# Patient Record
Sex: Male | Born: 1995 | Race: White | Hispanic: No | Marital: Single | Smoking: Never smoker
Health system: Southern US, Community
[De-identification: ages and names within clinical notes are randomized; demographics above are authoritative.]

## PROBLEM LIST (undated history)

## (undated) DIAGNOSIS — F329 Major depressive disorder, single episode, unspecified: Secondary | ICD-10-CM

## (undated) DIAGNOSIS — F32A Depression, unspecified: Secondary | ICD-10-CM

## (undated) DIAGNOSIS — F419 Anxiety disorder, unspecified: Secondary | ICD-10-CM

## (undated) HISTORY — DX: Major depressive disorder, single episode, unspecified: F32.9

## (undated) HISTORY — DX: Anxiety disorder, unspecified: F41.9

## (undated) HISTORY — DX: Depression, unspecified: F32.A

---
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
## 2017-01-11 ENCOUNTER — Ambulatory Visit: Payer: Managed Care, Other (non HMO)

## 2017-02-17 ENCOUNTER — Encounter: Payer: Self-pay | Admitting: Family Medicine

## 2017-02-17 ENCOUNTER — Ambulatory Visit: Payer: Managed Care, Other (non HMO) | Admitting: Family Medicine

## 2017-02-17 VITALS — BP 112/80 | HR 72 | Ht 72.0 in | Wt 195.0 lb

## 2017-02-17 DIAGNOSIS — Z789 Other specified health status: Secondary | ICD-10-CM | POA: Diagnosis not present

## 2017-02-17 DIAGNOSIS — Z0000 Encounter for general adult medical examination without abnormal findings: Secondary | ICD-10-CM | POA: Diagnosis not present

## 2017-02-17 DIAGNOSIS — Z23 Encounter for immunization: Secondary | ICD-10-CM | POA: Diagnosis not present

## 2017-02-17 NOTE — Progress Notes (Signed)
Name: Kevin DanielsCameron Hunt   MRN: [id number]    DOB: 06/08/1995   Date:02/17/2017       Progress Note  Subjective  Chief Complaint  Chief Complaint  Patient presents with  . tdap vacc    needed for college    Patient present for updating immunization.    No problem-specific Assessment & Plan notes found for this encounter.   Past Medical History:  Diagnosis Date  . Anxiety   . Depression     History reviewed. No pertinent surgical history.  Family History  Problem Relation Age of Onset  . Cancer Mother   . Diabetes Mother     Social History   Socioeconomic History  . Marital status: Unknown    Spouse name: Not on file  . Number of children: Not on file  . Years of education: Not on file  . Highest education level: Not on file  Social Needs  . Financial resource strain: Not on file  . Food insecurity - worry: Not on file  . Food insecurity - inability: Not on file  . Transportation needs - medical: Not on file  . Transportation needs - non-medical: Not on file  Occupational History  . Not on file  Tobacco Use  . Smoking status: Never Smoker  . Smokeless tobacco: Never Used  Substance and Sexual Activity  . Alcohol use: No    Alcohol/week: 0.0 oz  . Drug use: No  . Sexual activity: No  Other Topics Concern  . Not on file  Social History Narrative  . Not on file    Allergies  Allergen Reactions  . Sulfa Antibiotics     Outpatient Medications Prior to Visit  Medication Sig Dispense Refill  . DULoxetine (CYMBALTA) 60 MG capsule Take 1 capsule by mouth daily. Psychiatrist    . amoxicillin (AMOXIL) 500 MG capsule Take 1 capsule (500 mg total) by mouth 3 (three) times daily. 30 capsule 0  . neomycin-polymyxin-hydrocortisone (CORTISPORIN) 3.5-10000-1 ophthalmic suspension Place 4 drops into the left eye 4 (four) times daily. 7.5 mL 0   No facility-administered medications prior to visit.     Review of Systems  Constitutional: Negative for chills, fever,  malaise/fatigue and weight loss.  HENT: Negative for ear discharge, ear pain and sore throat.   Eyes: Negative for blurred vision.  Respiratory: Negative for cough, sputum production, shortness of breath and wheezing.   Cardiovascular: Negative for chest pain, palpitations and leg swelling.  Gastrointestinal: Negative for abdominal pain, blood in stool, constipation, diarrhea, heartburn, melena and nausea.  Genitourinary: Negative for dysuria, frequency, hematuria and urgency.  Musculoskeletal: Negative for back pain, joint pain, myalgias and neck pain.  Skin: Negative for rash.  Neurological: Negative for dizziness, tingling, sensory change, focal weakness and headaches.  Endo/Heme/Allergies: Negative for environmental allergies and polydipsia. Does not bruise/bleed easily.  Psychiatric/Behavioral: Negative for depression and suicidal ideas. The patient is not nervous/anxious and does not have insomnia.      Objective  Vitals:   02/17/17 1429  BP: 112/80  Pulse: 72  Weight: 195 lb (88.5 kg)  Height: 6' (1.829 m)    Physical Exam  Constitutional: He is oriented to person, place, and time and well-developed, well-nourished, and in no distress.  HENT:  Head: Normocephalic.  Right Ear: External ear normal.  Left Ear: External ear normal.  Nose: Nose normal.  Mouth/Throat: Oropharynx is clear and moist.  Eyes: Conjunctivae and EOM are normal. Pupils are equal, round, and reactive to light. Right eye  exhibits no discharge. Left eye exhibits no discharge. No scleral icterus.  Neck: Normal range of motion. Neck supple. No JVD present. No tracheal deviation present. No thyromegaly present.  Cardiovascular: Normal rate, regular rhythm, normal heart sounds and intact distal pulses. Exam reveals no gallop and no friction rub.  No murmur heard. Pulmonary/Chest: Breath sounds normal. No respiratory distress. He has no wheezes. He has no rales.  Abdominal: Soft. Bowel sounds are normal. He  exhibits no mass. There is no hepatosplenomegaly. There is no tenderness. There is no rebound, no guarding and no CVA tenderness.  Musculoskeletal: Normal range of motion. He exhibits no edema or tenderness.  Lymphadenopathy:    He has no cervical adenopathy.  Neurological: He is alert and oriented to person, place, and time. He has normal sensation, normal strength, normal reflexes and intact cranial nerves. No cranial nerve deficit.  Skin: Skin is warm. No rash noted.  Psychiatric: Mood and affect normal.  Nursing note and vitals reviewed.     Assessment & Plan  Problem List Items Addressed This Visit    None    Visit Diagnoses    General medical exam    -  Primary   review immunization/    Need for diphtheria-tetanus-pertussis (Tdap) vaccine       Relevant Orders   Tdap vaccine greater than or equal to 7yo IM (Completed)   Hepatitis B vaccination status unknown       Relevant Orders   Hepatitis B surface antibody      No orders of the defined types were placed in this encounter.     Dr. Hayden [facility]  02/17/17

## 2017-02-18 LAB — HEPATITIS B SURFACE ANTIBODY,QUALITATIVE: Hep B Surface Ab, Qual: NONREACTIVE

## 2017-02-23 ENCOUNTER — Ambulatory Visit (INDEPENDENT_AMBULATORY_CARE_PROVIDER_SITE_OTHER): Payer: Managed Care, Other (non HMO)

## 2017-02-23 DIAGNOSIS — Z23 Encounter for immunization: Secondary | ICD-10-CM

## 2017-02-23 MED ORDER — HEPATITIS B VAC RECOMBINANT 10 MCG/0.5ML IJ SUSP
0.5000 mL | Freq: Once | INTRAMUSCULAR | Status: AC
  Administered 2017-02-23: 0.5 mL via INTRAMUSCULAR

## 2017-03-23 ENCOUNTER — Ambulatory Visit (INDEPENDENT_AMBULATORY_CARE_PROVIDER_SITE_OTHER): Payer: Managed Care, Other (non HMO)

## 2017-03-23 DIAGNOSIS — Z23 Encounter for immunization: Secondary | ICD-10-CM | POA: Diagnosis not present

## 2017-03-23 MED ORDER — HEPATITIS B VAC RECOMBINANT 10 MCG/ML IJ SUSP
1.0000 mL | Freq: Once | INTRAMUSCULAR | Status: AC
  Administered 2017-03-23: 1 mL via INTRAMUSCULAR

## 2017-09-14 ENCOUNTER — Ambulatory Visit: Payer: Managed Care, Other (non HMO) | Admitting: Family Medicine

## 2017-10-24 ENCOUNTER — Ambulatory Visit (INDEPENDENT_AMBULATORY_CARE_PROVIDER_SITE_OTHER): Payer: Managed Care, Other (non HMO)

## 2017-10-24 DIAGNOSIS — Z23 Encounter for immunization: Secondary | ICD-10-CM

## 2017-10-24 MED ORDER — HEPATITIS B VAC RECOMBINANT 10 MCG/ML IJ SUSP
1.0000 mL | Freq: Once | INTRAMUSCULAR | Status: AC
  Administered 2017-10-24: 10 ug via INTRAMUSCULAR

## 2018-09-17 ENCOUNTER — Ambulatory Visit
Admission: EM | Admit: 2018-09-17 | Discharge: 2018-09-17 | Disposition: A | Discharge status: Home / Self Care | Payer: Managed Care, Other (non HMO) | Attending: Family Medicine | Admitting: Family Medicine

## 2018-09-17 ENCOUNTER — Other Ambulatory Visit: Payer: Self-pay

## 2018-09-17 DIAGNOSIS — L03115 Cellulitis of right lower limb: Secondary | ICD-10-CM

## 2018-09-17 DIAGNOSIS — L03116 Cellulitis of left lower limb: Secondary | ICD-10-CM

## 2018-09-17 MED ORDER — CEPHALEXIN 500 MG PO CAPS: 500.0000 mg | ORAL_CAPSULE | Freq: Four times a day (QID) | ORAL | 0 refills | Status: DC

## 2018-09-17 NOTE — ED Provider Notes (Signed)
[facility]    CSN: [id number] Arrival date & time: 09/17/18  1815      History   Chief Complaint Chief Complaint  Patient presents with  . Skin Problem    HPI Kevin Hunt is a 23 y.o. male.   23 yo male with a c/o lower leg skin redness, tenderness and swelling to both legs for the past week. Denies any injuries, fevers, chills, drainage.       Past Medical History:  Diagnosis Date  . Anxiety   . Depression     There are no active problems to display for this patient.   History reviewed. No pertinent surgical history.     Home Medications    Prior to Admission medications   Medication Sig Start Date End Date Taking? Authorizing Provider  cephALEXin (KEFLEX) 500 MG capsule Take 1 capsule (500 mg total) by mouth 4 (four) times daily for 7 days. 09/17/18 09/24/18  Norval Gable, MD  DULoxetine (CYMBALTA) 60 MG capsule Take 1 capsule by mouth daily. Psychiatrist 04/03/15   [provider]    Family History Family History  Problem Relation Age of Onset  . Cancer Mother   . Diabetes Mother     Social History Social History   Tobacco Use  . Smoking status: Never Smoker  . Smokeless tobacco: Never Used  Substance Use Topics  . Alcohol use: No    Alcohol/week: 0.0 standard drinks  . Drug use: No     Allergies   Sulfa antibiotics   Review of Systems Review of Systems   Physical Exam Triage Vital Signs ED Triage Vitals  Enc Vitals Group     BP 09/17/18 1826 127/68     Pulse Rate 09/17/18 1826 85     Resp 09/17/18 1826 16     Temp 09/17/18 1826 98.8 F (37.1 C)     Temp Source 09/17/18 1826 Oral     SpO2 09/17/18 1826 100 %     Weight 09/17/18 1827 194 lb (88 kg)     Height --      Head Circumference --      Peak Flow --      Pain Score 09/17/18 1826 6     Pain Loc --      Pain Edu? --      Excl. in [place]? --    No data found.  Updated Vital Signs BP 127/68 (BP Location: Left Arm)   Pulse 85   Temp 98.8 F  (37.1 C) (Oral)   Resp 16   Wt 88 kg   SpO2 100%   BMI 26.31 kg/m   Visual Acuity Right Eye Distance:   Left Eye Distance:   Bilateral Distance:    Right Eye Near:   Left Eye Near:    Bilateral Near:     Physical Exam Vitals signs and nursing note reviewed.  Constitutional:      General: He is not in acute distress.    Appearance: He is not toxic-appearing or diaphoretic.  Musculoskeletal:     Right lower leg: Edema present.     Left lower leg: Edema present.  Skin:    Findings: Erythema (warmth, blanchable erythema and tenderness to palpation to skin on shins bilaterally), lesion (multiple pinpiont pustules noted on lower extremity skin bilaterally) and rash present. Rash is pustular.     Comments: Lower extremity edema bilaterally; extremities neurovascularly intact  Neurological:     Mental Status: He is alert.  UC Treatments / Results  Labs (all labs ordered are listed, but only abnormal results are displayed) Labs Reviewed - No data to display  EKG   Radiology No results found.  Procedures Procedures (including critical care time)  Medications Ordered in UC Medications - No data to display  Initial Impression / Assessment and Plan / UC Course  I have reviewed the triage vital signs and the nursing notes.  Pertinent labs & imaging results that were available during my care of the patient were reviewed by me and considered in my medical decision making (see chart for details).      Final Clinical Impressions(s) / UC Diagnoses   Final diagnoses:  Cellulitis of right lower extremity  Cellulitis of left lower extremity     Discharge Instructions     Warm compresses to area and elevate legs Tylenol and/or advil as needed    ED Prescriptions    Medication Sig Dispense Auth. Provider   cephALEXin (KEFLEX) 500 MG capsule Take 1 capsule (500 mg total) by mouth 4 (four) times daily for 7 days. 28 capsule Payton Mccallumonty, Deagen Krass, MD      1.  diagnosis reviewed with patient 2. rx as per orders above; reviewed possible side effects, interactions, risks and benefits  3. Recommend supportive treatment as above 4. Follow-up prn if symptoms worsen or don't improve  Controlled Substance Prescriptions [place] Controlled Substance Registry consulted? Not Applicable   Payton Mccallumonty, Kwynn Schlotter, MD 09/17/18 [phone]

## 2018-09-17 NOTE — ED Triage Notes (Signed)
Pt noticed his lower legs discoloring about a week ago. Redness bilaterally, sore if something rubs against it, lower legs swollen. Denies warmth to the areas.

## 2018-09-17 NOTE — Discharge Instructions (Signed)
Warm compresses to area and elevate legs Tylenol and/or advil as needed

## 2018-09-21 ENCOUNTER — Ambulatory Visit (INDEPENDENT_AMBULATORY_CARE_PROVIDER_SITE_OTHER): Payer: Managed Care, Other (non HMO) | Admitting: Family Medicine

## 2018-09-21 ENCOUNTER — Encounter: Payer: Self-pay | Admitting: Family Medicine

## 2018-09-21 ENCOUNTER — Ambulatory Visit
Admission: RE | Admit: 2018-09-21 | Discharge: 2018-09-21 | Disposition: A | Discharge status: Home / Self Care | Payer: Managed Care, Other (non HMO) | Source: Ambulatory Visit | Attending: Family Medicine | Admitting: Family Medicine

## 2018-09-21 ENCOUNTER — Other Ambulatory Visit: Payer: Self-pay

## 2018-09-21 VITALS — BP 120/60 | HR 120 | Temp 98.7°F | Ht 72.0 in | Wt 195.0 lb

## 2018-09-21 DIAGNOSIS — R2241 Localized swelling, mass and lump, right lower limb: Secondary | ICD-10-CM | POA: Diagnosis not present

## 2018-09-21 DIAGNOSIS — R2242 Localized swelling, mass and lump, left lower limb: Secondary | ICD-10-CM | POA: Diagnosis not present

## 2018-09-21 DIAGNOSIS — Z23 Encounter for immunization: Secondary | ICD-10-CM

## 2018-09-21 NOTE — Progress Notes (Signed)
Date:  09/21/2018   Name:  Kevin Hunt   DOB:  1995-02-24   MRN:  [id number]   Chief Complaint: Cellulitis (has had 3 days of keflex from urgent care for cellulitis of both legs, but worse in the left leg- has pics of leg) and influenza vacc need  Leg Pain  The incident occurred more than 1 week ago. There was no injury mechanism. The pain is present in the left leg and right leg (lower aspects). The pain is at a severity of 6/10. The pain is moderate. The pain has been constant since onset. Pertinent negatives include no inability to bear weight, loss of motion, loss of sensation, muscle weakness or numbness. Nothing aggravates the symptoms. The treatment provided moderate relief.    Review of Systems  Constitutional: Negative for chills and fever.  HENT: Negative for drooling, ear discharge, ear pain, postnasal drip and sore throat.   Respiratory: Negative for cough, chest tightness, shortness of breath and wheezing.   Cardiovascular: Negative for chest pain, palpitations and leg swelling.  Gastrointestinal: Negative for abdominal pain, blood in stool, constipation, diarrhea and nausea.  Endocrine: Negative for polydipsia.  Genitourinary: Negative for dysuria, frequency, hematuria, penile swelling and urgency.  Musculoskeletal: Negative for back pain, myalgias and neck pain.  Skin: Negative for rash.  Allergic/Immunologic: Negative for environmental allergies.  Neurological: Negative for dizziness, numbness and headaches.  Hematological: Does not bruise/bleed easily.  Psychiatric/Behavioral: Negative for suicidal ideas. The patient is not nervous/anxious.     There are no active problems to display for this patient.   Allergies  Allergen Reactions  . Sulfa Antibiotics     No past surgical history on file.  Social History   Tobacco Use  . Smoking status: Never Smoker  . Smokeless tobacco: Never Used  Substance Use Topics  . Alcohol use: No    Alcohol/week: 0.0  standard drinks  . Drug use: No     Medication list has been reviewed and updated.  Current Meds  Medication Sig  . cephALEXin (KEFLEX) 500 MG capsule Take 1 capsule (500 mg total) by mouth 4 (four) times daily for 7 days.    PHQ 2/9 Scores 02/17/2017 05/14/2015  PHQ - 2 Score 0 0  PHQ- 9 Score 2 -    BP Readings from Last 3 Encounters:  09/21/18 120/60  09/17/18 127/68  02/17/17 112/80    Physical Exam Vitals signs and nursing note reviewed.  HENT:     Head: Normocephalic.     Right Ear: External ear normal.     Left Ear: External ear normal.     Nose: Nose normal.  Eyes:     General: No scleral icterus.       Right eye: No discharge.        Left eye: No discharge.     Conjunctiva/sclera: Conjunctivae normal.     Pupils: Pupils are equal, round, and reactive to light.  Neck:     Musculoskeletal: Normal range of motion and neck supple.     Thyroid: No thyromegaly.     Vascular: No JVD.     Trachea: No tracheal deviation.  Cardiovascular:     Rate and Rhythm: Normal rate and regular rhythm.     Heart sounds: Normal heart sounds. No murmur. No friction rub. No gallop.   Pulmonary:     Effort: No respiratory distress.     Breath sounds: Normal breath sounds. No wheezing or rales.  Abdominal:  General: Bowel sounds are normal.     Palpations: Abdomen is soft. There is no mass.     Tenderness: There is no abdominal tenderness. There is no guarding or rebound.  Musculoskeletal: Normal range of motion.        General: Swelling present. No tenderness.     Comments: Bilateral leg swelling/ left 16.5/right 16.25  Lymphadenopathy:     Cervical: No cervical adenopathy.  Skin:    General: Skin is warm.     Findings: No rash.  Neurological:     Mental Status: He is alert and oriented to person, place, and time.     Cranial Nerves: No cranial nerve deficit.     Deep Tendon Reflexes: Reflexes are normal and symmetric.     Wt Readings from Last 3 Encounters:   09/21/18 195 lb (88.5 kg)  09/17/18 194 lb (88 kg)  02/17/17 195 lb (88.5 kg)    BP 120/60   Pulse (!) 120   Temp 98.7 F (37.1 C) (Oral)   Ht 6' (1.829 m)   Wt 195 lb (88.5 kg)   BMI 26.45 kg/m   Assessment and Plan:   1. Localized swelling of left lower leg Patient has had bilateral leg swelling ever since starting a new job which requires him standing and walking more so.  Patient was seen earlier this week with bilateral erythema which is thought to be consistent with cellulitis.  There is been some improvement on the cephalexin.  However the swelling is still present with the left leg greater than the right by quarter of drainage.  There is some erythema in the lower calf area.  We will get a stat ultrasound of both legs to rule out popliteal thrombosis.  Patient has been instructed to continue cephalexin if there is no blood clot noted. - US Venous Img Lower Bilateral; Future  2. Localized swelling of right lower leg See above - US Venous Img Lower Bilateral; Future   Patient underwent ultrasound of both legs and there was no venous thrombosis noted in either leg.  Patient was instructed to continue his cephalexin and will contact us on Monday as to condition of his swelling.  In the meantime he is been given a note for Monday and Tuesday for employment.

## 2018-09-24 ENCOUNTER — Ambulatory Visit: Payer: Managed Care, Other (non HMO)

## 2018-09-24 ENCOUNTER — Telehealth: Payer: Self-pay

## 2018-09-24 ENCOUNTER — Other Ambulatory Visit: Payer: Self-pay

## 2018-09-24 DIAGNOSIS — L03818 Cellulitis of other sites: Secondary | ICD-10-CM

## 2018-09-24 DIAGNOSIS — R2242 Localized swelling, mass and lump, left lower limb: Secondary | ICD-10-CM

## 2018-09-24 MED ORDER — CEPHALEXIN 500 MG PO CAPS: 500.0000 mg | ORAL_CAPSULE | Freq: Two times a day (BID) | ORAL | 0 refills | Status: AC

## 2018-09-24 NOTE — Telephone Encounter (Signed)
LM that he is much better but only has 3 pills and still has some swelling. Can we refill?

## 2018-09-24 NOTE — Telephone Encounter (Signed)
Sent in an additional 7 days of cephalexin to [facility] [address]- will take BID instead of QID. This was explained to the pt- should be better after this

## 2018-10-04 ENCOUNTER — Other Ambulatory Visit: Payer: Self-pay

## 2018-10-04 ENCOUNTER — Telehealth: Payer: Self-pay

## 2018-10-04 DIAGNOSIS — L03818 Cellulitis of other sites: Secondary | ICD-10-CM

## 2018-10-04 MED ORDER — CEPHALEXIN 500 MG PO CAPS: 500.0000 mg | ORAL_CAPSULE | Freq: Two times a day (BID) | ORAL | 0 refills | Status: DC

## 2018-10-04 NOTE — Telephone Encounter (Signed)
Pt called in wanting another round of antibiotic for leg and asked that I call his mom since he is "at work." I called his mom and told her we could send in another round of antibiotics but pt needs to come in to see Dr Ronnald Ramp after completing antibiotics per Ronnald Ramp. She said she would let the pt know.

## 2018-10-04 NOTE — Progress Notes (Unsigned)
Sent in cephalexin to [facility] [address]

## 2019-01-07 ENCOUNTER — Ambulatory Visit: Payer: Managed Care, Other (non HMO) | Attending: Internal Medicine

## 2019-01-07 DIAGNOSIS — Z20822 Contact with and (suspected) exposure to covid-19: Secondary | ICD-10-CM

## 2019-01-09 LAB — NOVEL CORONAVIRUS, NAA: SARS-CoV-2, NAA: DETECTED — AB

## 2019-01-16 ENCOUNTER — Ambulatory Visit: Payer: Managed Care, Other (non HMO) | Attending: Internal Medicine

## 2019-01-16 DIAGNOSIS — Z20822 Contact with and (suspected) exposure to covid-19: Secondary | ICD-10-CM

## 2019-01-17 LAB — NOVEL CORONAVIRUS, NAA: SARS-CoV-2, NAA: NOT DETECTED

## 2019-06-13 ENCOUNTER — Ambulatory Visit: Payer: Managed Care, Other (non HMO)

## 2019-06-24 ENCOUNTER — Ambulatory Visit: Payer: Managed Care, Other (non HMO) | Admitting: Family Medicine

## 2019-06-24 ENCOUNTER — Other Ambulatory Visit: Payer: Self-pay

## 2019-06-24 ENCOUNTER — Telehealth: Payer: Self-pay | Admitting: Family Medicine

## 2019-06-24 ENCOUNTER — Encounter: Payer: Self-pay | Admitting: Family Medicine

## 2019-06-24 VITALS — BP 126/74 | HR 76 | Ht 72.0 in | Wt 221.0 lb

## 2019-06-24 DIAGNOSIS — J069 Acute upper respiratory infection, unspecified: Secondary | ICD-10-CM | POA: Diagnosis not present

## 2019-06-24 NOTE — Progress Notes (Signed)
Date:  06/24/2019   Name:  Kevin Hunt   DOB:  01-29-1995   MRN:  [id number]   Chief Complaint: Sinusitis (cold X2days, runny nose, cough (yellow mucous) no fever, little pressure between eyes )  Sinusitis This is a new problem. The current episode started in the past 7 days. The problem is unchanged. There has been no fever. The pain is mild. Associated symptoms include congestion, sinus pressure and sneezing. Pertinent negatives include no chills, coughing, diaphoresis, ear pain, headaches, hoarse voice, neck pain, shortness of breath, sore throat or swollen glands. (Purulent nasal discharge) Past treatments include oral decongestants. The treatment provided mild relief.    No results found for: CREATININE, BUN, NA, K, CL, CO2 No results found for: CHOL, HDL, LDLCALC, LDLDIRECT, TRIG, CHOLHDL No results found for: TSH No results found for: HGBA1C No results found for: WBC, HGB, HCT, MCV, PLT No results found for: ALT, AST, GGT, ALKPHOS, BILITOT   Review of Systems  Constitutional: Negative for chills, diaphoresis and fever.  HENT: Positive for congestion, sinus pressure and sneezing. Negative for drooling, ear discharge, ear pain, hoarse voice, postnasal drip, rhinorrhea, sinus pain and sore throat.   Respiratory: Negative for cough, shortness of breath and wheezing.   Cardiovascular: Negative for chest pain, palpitations and leg swelling.  Gastrointestinal: Negative for abdominal pain, blood in stool, constipation, diarrhea and nausea.  Endocrine: Negative for polydipsia.  Genitourinary: Negative for dysuria, frequency, hematuria and urgency.  Musculoskeletal: Negative for back pain, myalgias and neck pain.  Skin: Negative for rash.  Allergic/Immunologic: Negative for environmental allergies, food allergies and immunocompromised state.  Neurological: Negative for dizziness and headaches.  Hematological: Does not bruise/bleed easily.  Psychiatric/Behavioral: Negative for  suicidal ideas. The patient is not nervous/anxious.     There are no problems to display for this patient.   Allergies  Allergen Reactions  . Sulfa Antibiotics     History reviewed. No pertinent surgical history.  Social History   Tobacco Use  . Smoking status: Never Smoker  . Smokeless tobacco: Never Used  Substance Use Topics  . Alcohol use: No    Alcohol/week: 0.0 standard drinks  . Drug use: No     Medication list has been reviewed and updated.  No outpatient medications have been marked as taking for the 06/24/19 encounter (Office Visit) with Duanne Limerick, MD.    [facility] 2/9 Scores 06/24/2019 02/17/2017 05/14/2015  PHQ - 2 Score 0 0 0  PHQ- 9 Score 0 2 -    GAD 7 : Generalized Anxiety Score 06/24/2019  Nervous, Anxious, on Edge 0  Control/stop worrying 2  Worry too much - different things 2  Trouble relaxing 0  Restless 0  Easily annoyed or irritable 0  Afraid - awful might happen 0  Total GAD 7 Score 4  Anxiety Difficulty Not difficult at all    BP Readings from Last 3 Encounters:  06/24/19 126/74  09/21/18 120/60  09/17/18 127/68    Physical Exam Vitals and nursing note reviewed.  HENT:     Head: Normocephalic.     Right Ear: Tympanic membrane, ear canal and external ear normal.     Left Ear: Tympanic membrane, ear canal and external ear normal.     Nose: Congestion and rhinorrhea present.     Mouth/Throat:     Mouth: Mucous membranes are moist.  Eyes:     General: No scleral icterus.       Right eye: No discharge.  Left eye: No discharge.     Conjunctiva/sclera: Conjunctivae normal.     Pupils: Pupils are equal, round, and reactive to light.  Neck:     Thyroid: No thyromegaly.     Vascular: No JVD.     Trachea: No tracheal deviation.  Cardiovascular:     Rate and Rhythm: Normal rate and regular rhythm.     Heart sounds: Normal heart sounds. No murmur heard.  No friction rub. No gallop.   Pulmonary:     Effort: No respiratory  distress.     Breath sounds: Normal breath sounds. No decreased breath sounds, wheezing, rhonchi or rales.  Abdominal:     General: Bowel sounds are normal.     Palpations: Abdomen is soft. There is no mass.     Tenderness: There is no abdominal tenderness. There is no guarding or rebound.  Musculoskeletal:        General: No tenderness. Normal range of motion.     Cervical back: Normal range of motion and neck supple.  Lymphadenopathy:     Cervical: No cervical adenopathy.  Skin:    General: Skin is warm.     Findings: No rash.  Neurological:     Mental Status: He is alert and oriented to person, place, and time.     Cranial Nerves: No cranial nerve deficit.     Deep Tendon Reflexes: Reflexes are normal and symmetric.     Wt Readings from Last 3 Encounters:  06/24/19 221 lb (100.2 kg)  09/21/18 195 lb (88.5 kg)  09/17/18 194 lb (88 kg)    BP 126/74   Pulse 76   Ht 6' (1.829 m)   Wt 221 lb (100.2 kg)   BMI 29.97 kg/m   Assessment and Plan: 1. Viral upper respiratory tract infection New onset.  Resolving.  Stable.  Patient has gradually improved over the course of the last 48 hours with upper respiratory congestion.  This is consistent with a viral/allergic rhinitis and patient will continue with over-the-counter antihistamine decongestant with mucolytic agent.

## 2019-06-24 NOTE — Telephone Encounter (Signed)
Patient is calling to get an appt with Dr. Asencion Partridge because Dr. Yetta Barre is out of the office. Patient is wanting to be seen virtually for cough and sneezing. Patient is also need a note to return back to work. Please advise CB- [phone]

## 2019-12-18 ENCOUNTER — Other Ambulatory Visit: Payer: Self-pay

## 2019-12-18 ENCOUNTER — Ambulatory Visit (INDEPENDENT_AMBULATORY_CARE_PROVIDER_SITE_OTHER): Payer: Managed Care, Other (non HMO) | Admitting: Family Medicine

## 2019-12-18 ENCOUNTER — Encounter: Payer: Self-pay | Admitting: Family Medicine

## 2019-12-18 VITALS — BP 120/80 | HR 80 | Ht 72.0 in | Wt 219.0 lb

## 2019-12-18 DIAGNOSIS — J069 Acute upper respiratory infection, unspecified: Secondary | ICD-10-CM

## 2019-12-18 MED ORDER — DM-GUAIFENESIN ER 30-600 MG PO TB12: 1.0000 | ORAL_TABLET | Freq: Two times a day (BID) | ORAL | 1 refills | Status: AC

## 2019-12-18 NOTE — Progress Notes (Signed)
Date:  12/18/2019   Name:  Kevin Hunt   DOB:  [birth year]   MRN:  [id number]   Chief Complaint: Cough (Developed a cough after getting covid booster 4 days ago- coughing up clear production)  Cough This is a new problem. The current episode started in the past 7 days. The problem has been waxing and waning. The cough is non-productive. Associated symptoms include nasal congestion and rhinorrhea. Pertinent negatives include no chest pain, chills, ear congestion, ear pain, fever, headaches, heartburn, hemoptysis, myalgias, postnasal drip, rash, sore throat, shortness of breath, sweats, weight loss or wheezing. Nothing aggravates the symptoms. He has tried OTC cough suppressant for the symptoms. The treatment provided mild relief. There is no history of environmental allergies.    No results found for: CREATININE, BUN, NA, K, CL, CO2 No results found for: CHOL, HDL, LDLCALC, LDLDIRECT, TRIG, CHOLHDL No results found for: TSH No results found for: HGBA1C No results found for: WBC, HGB, HCT, MCV, PLT No results found for: ALT, AST, GGT, ALKPHOS, BILITOT   Review of Systems  Constitutional: Negative for chills, fatigue, fever and weight loss.  HENT: Positive for congestion and rhinorrhea. Negative for drooling, ear discharge, ear pain, postnasal drip, sinus pressure, sinus pain, sneezing and sore throat.   Respiratory: Positive for cough. Negative for hemoptysis, shortness of breath and wheezing.   Cardiovascular: Negative for chest pain, palpitations and leg swelling.  Gastrointestinal: Negative for abdominal pain, blood in stool, constipation, diarrhea, heartburn and nausea.  Endocrine: Negative for polydipsia.  Genitourinary: Negative for dysuria, frequency, hematuria and urgency.  Musculoskeletal: Negative for arthralgias, back pain, myalgias and neck pain.  Skin: Negative for rash.  Allergic/Immunologic: Negative for environmental allergies.  Neurological: Negative for dizziness  and headaches.  Hematological: Does not bruise/bleed easily.  Psychiatric/Behavioral: Negative for suicidal ideas. The patient is not nervous/anxious.     There are no problems to display for this patient.   Allergies  Allergen Reactions  . Sulfa Antibiotics     No past surgical history on file.  Social History   Tobacco Use  . Smoking status: Never Smoker  . Smokeless tobacco: Never Used  Substance Use Topics  . Alcohol use: No    Alcohol/week: 0.0 standard drinks  . Drug use: No     Medication list has been reviewed and updated.  No outpatient medications have been marked as taking for the 12/18/19 encounter (Office Visit) with Duanne Limerick, MD.    [facility] 2/9 Scores 06/24/2019 02/17/2017 05/14/2015  PHQ - 2 Score 0 0 0  PHQ- 9 Score 0 2 -    GAD 7 : Generalized Anxiety Score 06/24/2019  Nervous, Anxious, on Edge 0  Control/stop worrying 2  Worry too much - different things 2  Trouble relaxing 0  Restless 0  Easily annoyed or irritable 0  Afraid - awful might happen 0  Total GAD 7 Score 4  Anxiety Difficulty Not difficult at all    BP Readings from Last 3 Encounters:  12/18/19 120/80  06/24/19 126/74  09/21/18 120/60    Physical Exam Vitals and nursing note reviewed.  HENT:     Head: Normocephalic.     Right Ear: Tympanic membrane, ear canal and external ear normal.     Left Ear: Tympanic membrane, ear canal and external ear normal.     Nose: Congestion present. No rhinorrhea.     Right Turbinates: Swollen.     Left Turbinates: Swollen.     Right Sinus:  No maxillary sinus tenderness or frontal sinus tenderness.     Left Sinus: No maxillary sinus tenderness or frontal sinus tenderness.     Mouth/Throat:     Mouth: Oropharynx is clear and moist. Mucous membranes are moist.     Palate: No mass.     Pharynx: Oropharynx is clear. Uvula midline.  Eyes:     General: No scleral icterus.       Right eye: No discharge.        Left eye: No discharge.      Extraocular Movements: EOM normal.     Conjunctiva/sclera: Conjunctivae normal.     Pupils: Pupils are equal, round, and reactive to light.  Neck:     Thyroid: No thyromegaly.     Vascular: No JVD.     Trachea: No tracheal deviation.  Cardiovascular:     Rate and Rhythm: Normal rate and regular rhythm.     Pulses: Intact distal pulses.     Heart sounds: Normal heart sounds. No murmur heard. No friction rub. No gallop.   Pulmonary:     Effort: No respiratory distress.     Breath sounds: Normal breath sounds. No wheezing or rales.  Abdominal:     General: Bowel sounds are normal.     Palpations: Abdomen is soft. There is no hepatosplenomegaly or mass.     Tenderness: There is no abdominal tenderness. There is no CVA tenderness, guarding or rebound.  Musculoskeletal:        General: No tenderness or edema. Normal range of motion.     Cervical back: Normal range of motion and neck supple.  Lymphadenopathy:     Cervical: No cervical adenopathy.     Right cervical: No superficial, deep or posterior cervical adenopathy.    Left cervical: No superficial, deep or posterior cervical adenopathy.  Skin:    General: Skin is warm.     Findings: No rash.  Neurological:     Mental Status: He is alert and oriented to person, place, and time.     Cranial Nerves: No cranial nerve deficit.     Deep Tendon Reflexes: Strength normal and reflexes are normal and symmetric.     Wt Readings from Last 3 Encounters:  12/18/19 219 lb (99.3 kg)  06/24/19 221 lb (100.2 kg)  09/21/18 195 lb (88.5 kg)    BP 120/80   Pulse 80   Ht 6' (1.829 m)   Wt 219 lb (99.3 kg)   BMI 29.70 kg/m   Assessment and Plan: 1. Viral upper respiratory tract infection New onset.  Persistent.  Relatively stable with no fever and no symptoms other than rhinorrhea and nasal congestion.  We will prescribe Mucinex DM for the cough and drainage.  A week ago had his Covid booster and manifested symptoms thereafter.  Patient's  been advised to continue to hydrate. - dextromethorphan-guaiFENesin (MUCINEX DM) 30-600 MG 12hr tablet; Take 1 tablet by mouth 2 (two) times daily.  Dispense: 30 tablet; Refill: 1

## 2020-09-18 IMAGING — US US EXTREM LOW VENOUS
1 series · 13 of 24 positions shown · non-contrast
Comparison: None.

CLINICAL DATA: Bilateral lower extremity pain and edema for the
past 2 weeks. Evaluate for DVT.



[Series 1: us extrem low venous · 0.09mm/px · 13 of 74 slices shown]
[im 1/74]
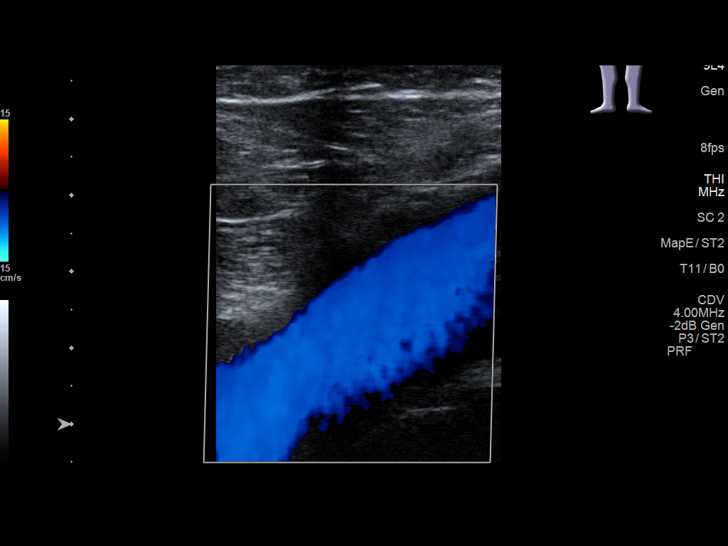
[im 7/74]
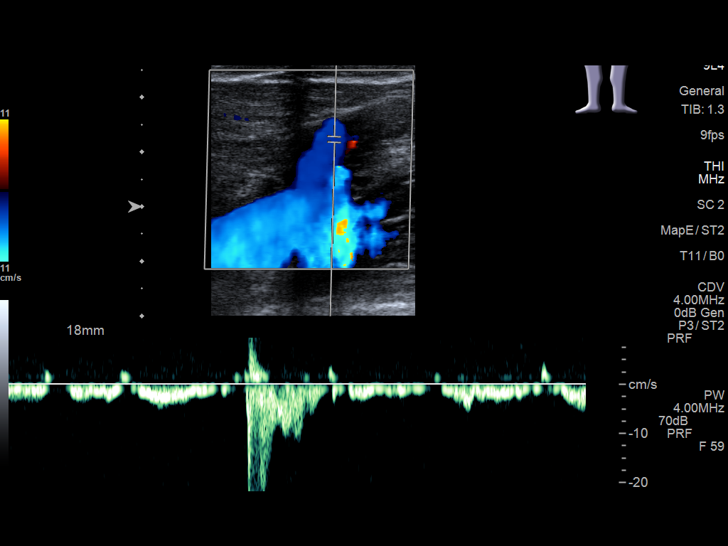
[im 13/74]
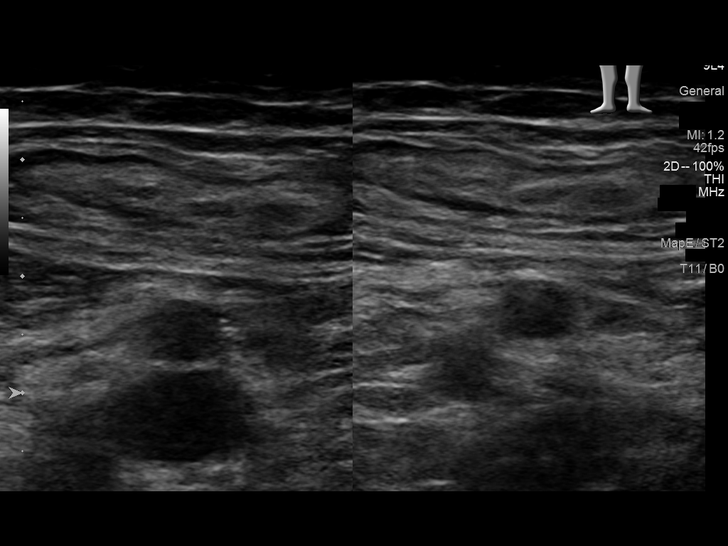
[im 20/74]
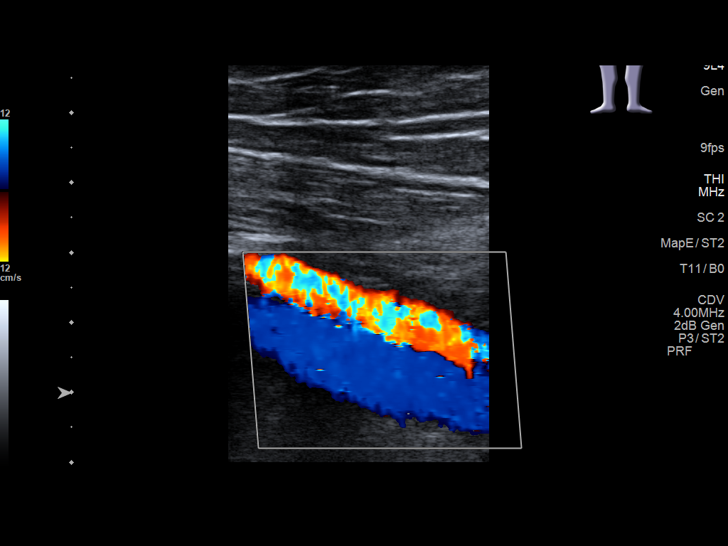
[im 26/74]
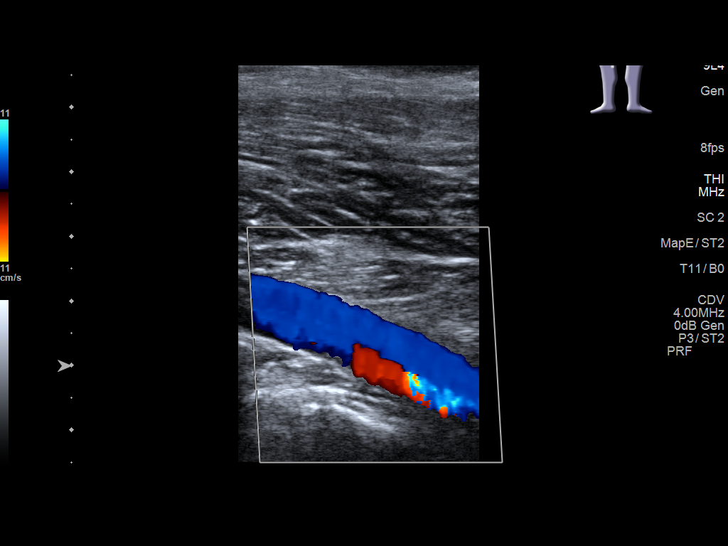
[im 32/74]
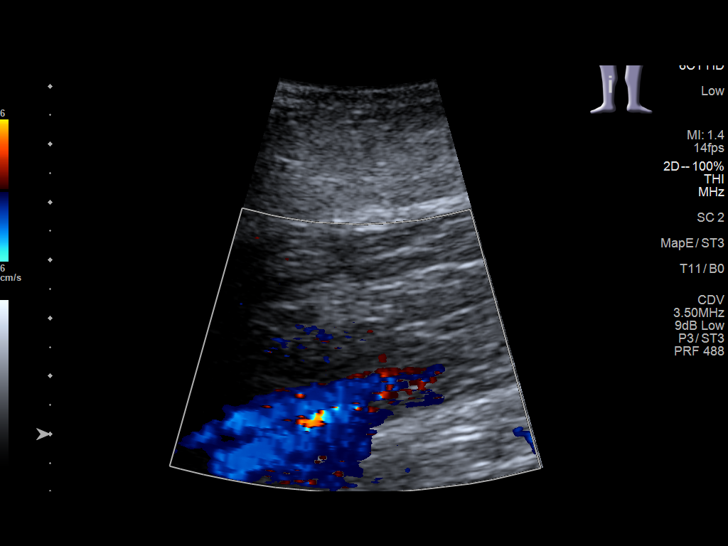
[im 39/74]
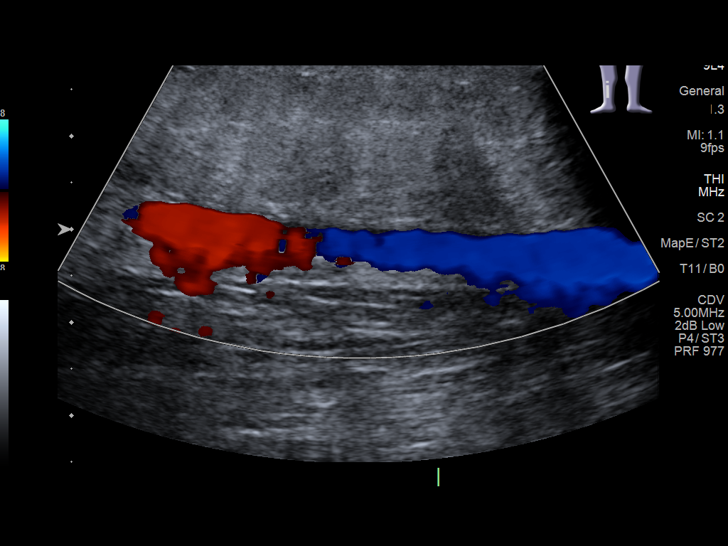
[im 42/74]
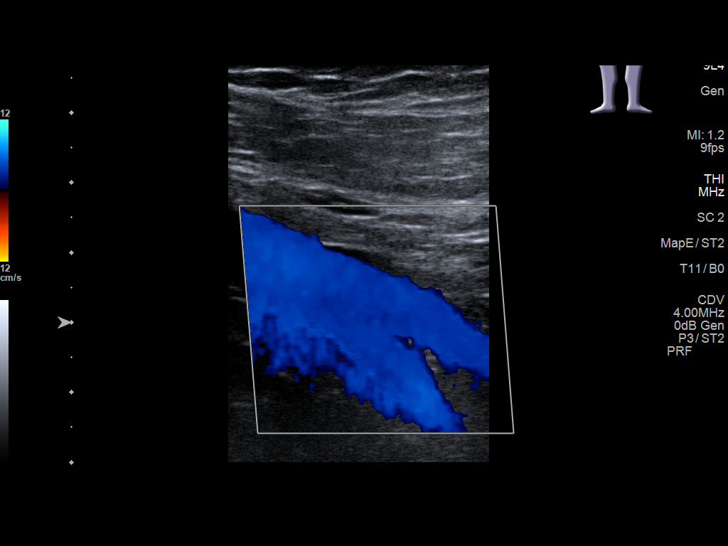
[im 48/74]
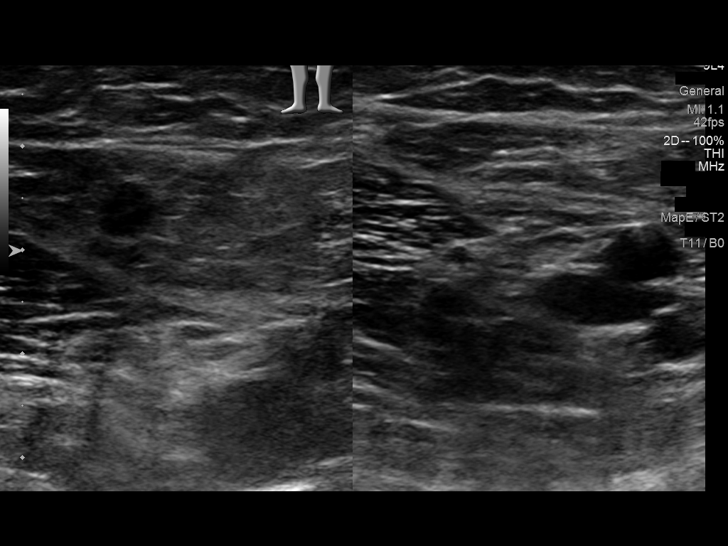
[im 54/74]
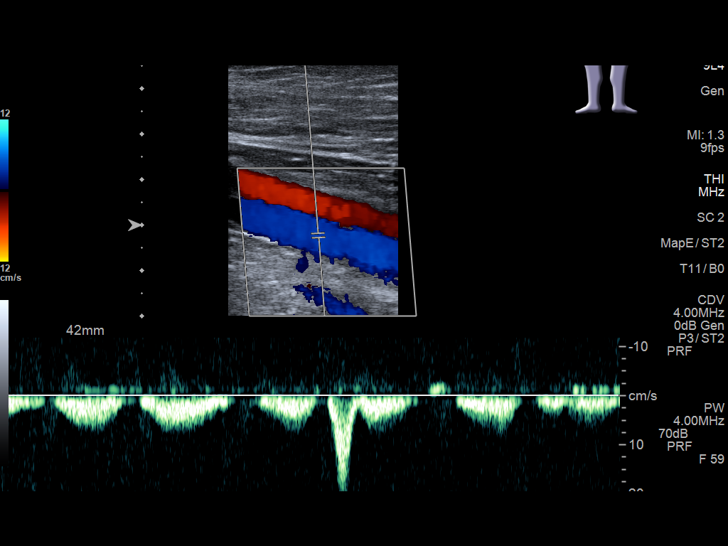
[im 61/74]
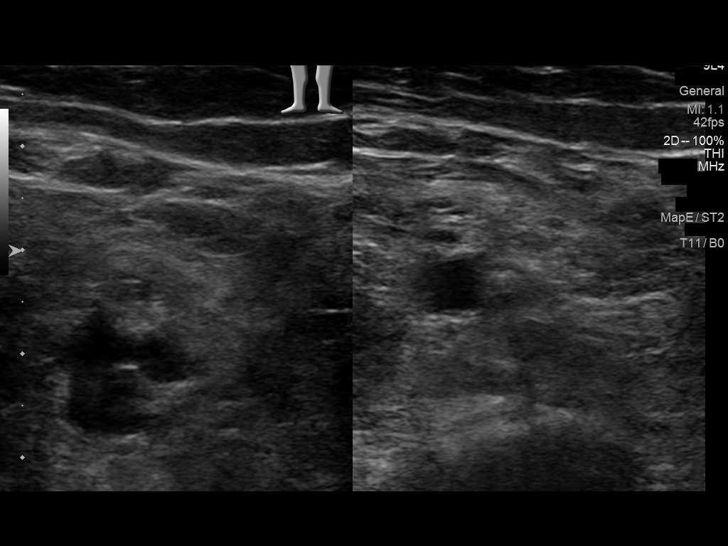
[im 67/74]
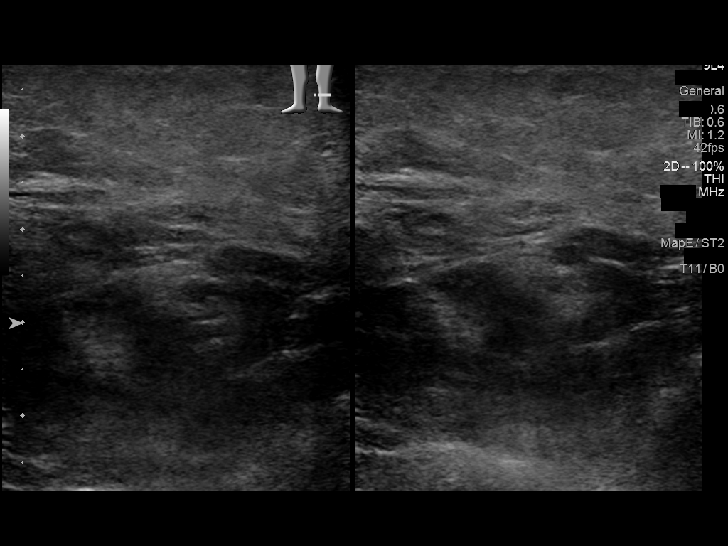
[im 74/74]
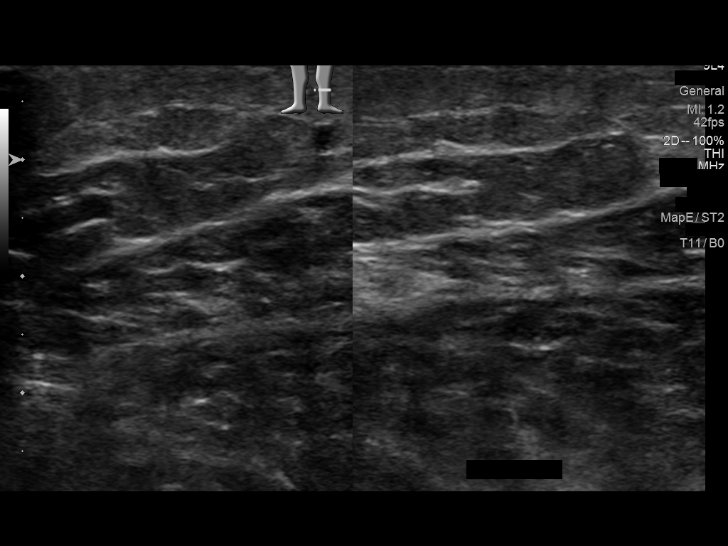

[13 of 24 positions shown; findings below may reference images not displayed]

FINDINGS: RIGHT LOWER EXTREMITY

Common Femoral Vein: No evidence of thrombus. Normal
compressibility, respiratory phasicity and response to augmentation.

Saphenofemoral Junction: No evidence of thrombus. Normal
compressibility and flow on color Doppler imaging.

Profunda Femoral Vein: No evidence of thrombus. Normal
compressibility and flow on color Doppler imaging.

Femoral Vein: No evidence of thrombus. Normal compressibility,
respiratory phasicity and response to augmentation.

Popliteal Vein: No evidence of thrombus. Normal compressibility,
respiratory phasicity and response to augmentation.

Calf Veins: No evidence of thrombus. Normal compressibility and flow
on color Doppler imaging.

Superficial Great Saphenous Vein: No evidence of thrombus. Normal
compressibility.

Venous Reflux:  None.

Other Findings: Note is made of a benign appearing non
pathologically enlarged right inguinal lymph node which is not
enlarged by size criteria measuring 0.7 cm in greatest short axis
diameter and maintains a benign fatty hilum (image 35).

LEFT LOWER EXTREMITY

Common Femoral Vein: No evidence of thrombus. Normal
compressibility, respiratory phasicity and response to augmentation.

Saphenofemoral Junction: No evidence of thrombus. Normal
compressibility and flow on color Doppler imaging.

Profunda Femoral Vein: No evidence of thrombus. Normal
compressibility and flow on color Doppler imaging.

Femoral Vein: No evidence of thrombus. Normal compressibility,
respiratory phasicity and response to augmentation.

Popliteal Vein: No evidence of thrombus. Normal compressibility,
respiratory phasicity and response to augmentation.

Calf Veins: No evidence of thrombus. Normal compressibility and flow
on color Doppler imaging.

Superficial Great Saphenous Vein: No evidence of thrombus. Normal
compressibility.

Venous Reflux:  None.

Other Findings:  None.
IMPRESSION: No evidence of DVT within either lower extremity.

## 2023-06-23 DIAGNOSIS — S0101XA Laceration without foreign body of scalp, initial encounter: Secondary | ICD-10-CM | POA: Diagnosis not present

## 2023-06-23 DIAGNOSIS — R519 Headache, unspecified: Secondary | ICD-10-CM | POA: Diagnosis not present

## 2023-06-23 DIAGNOSIS — W228XXA Striking against or struck by other objects, initial encounter: Secondary | ICD-10-CM | POA: Diagnosis not present

## 2023-07-06 DIAGNOSIS — F411 Generalized anxiety disorder: Secondary | ICD-10-CM | POA: Diagnosis not present

## 2023-07-06 DIAGNOSIS — F331 Major depressive disorder, recurrent, moderate: Secondary | ICD-10-CM | POA: Diagnosis not present

## 2023-07-13 DIAGNOSIS — F331 Major depressive disorder, recurrent, moderate: Secondary | ICD-10-CM | POA: Diagnosis not present

## 2023-07-13 DIAGNOSIS — F411 Generalized anxiety disorder: Secondary | ICD-10-CM | POA: Diagnosis not present

## 2023-07-17 DIAGNOSIS — F331 Major depressive disorder, recurrent, moderate: Secondary | ICD-10-CM | POA: Diagnosis not present

## 2023-07-17 DIAGNOSIS — F411 Generalized anxiety disorder: Secondary | ICD-10-CM | POA: Diagnosis not present

## 2023-07-19 DIAGNOSIS — F411 Generalized anxiety disorder: Secondary | ICD-10-CM | POA: Diagnosis not present

## 2023-07-19 DIAGNOSIS — F331 Major depressive disorder, recurrent, moderate: Secondary | ICD-10-CM | POA: Diagnosis not present

## 2023-07-27 DIAGNOSIS — F331 Major depressive disorder, recurrent, moderate: Secondary | ICD-10-CM | POA: Diagnosis not present

## 2023-07-27 DIAGNOSIS — F411 Generalized anxiety disorder: Secondary | ICD-10-CM | POA: Diagnosis not present

## 2023-07-31 DIAGNOSIS — F411 Generalized anxiety disorder: Secondary | ICD-10-CM | POA: Diagnosis not present

## 2023-07-31 DIAGNOSIS — F331 Major depressive disorder, recurrent, moderate: Secondary | ICD-10-CM | POA: Diagnosis not present

## 2023-08-10 DIAGNOSIS — F411 Generalized anxiety disorder: Secondary | ICD-10-CM | POA: Diagnosis not present

## 2023-08-10 DIAGNOSIS — F331 Major depressive disorder, recurrent, moderate: Secondary | ICD-10-CM | POA: Diagnosis not present

## 2023-08-14 DIAGNOSIS — F411 Generalized anxiety disorder: Secondary | ICD-10-CM | POA: Diagnosis not present

## 2023-08-14 DIAGNOSIS — F331 Major depressive disorder, recurrent, moderate: Secondary | ICD-10-CM | POA: Diagnosis not present

## 2023-08-24 DIAGNOSIS — F411 Generalized anxiety disorder: Secondary | ICD-10-CM | POA: Diagnosis not present

## 2023-08-24 DIAGNOSIS — F331 Major depressive disorder, recurrent, moderate: Secondary | ICD-10-CM | POA: Diagnosis not present

## 2023-09-07 DIAGNOSIS — F331 Major depressive disorder, recurrent, moderate: Secondary | ICD-10-CM | POA: Diagnosis not present

## 2023-09-07 DIAGNOSIS — F411 Generalized anxiety disorder: Secondary | ICD-10-CM | POA: Diagnosis not present

## 2023-09-19 DIAGNOSIS — F331 Major depressive disorder, recurrent, moderate: Secondary | ICD-10-CM | POA: Diagnosis not present

## 2023-09-19 DIAGNOSIS — F411 Generalized anxiety disorder: Secondary | ICD-10-CM | POA: Diagnosis not present

## 2023-10-16 DIAGNOSIS — F411 Generalized anxiety disorder: Secondary | ICD-10-CM | POA: Diagnosis not present

## 2023-10-16 DIAGNOSIS — F331 Major depressive disorder, recurrent, moderate: Secondary | ICD-10-CM | POA: Diagnosis not present

## 2023-11-13 DIAGNOSIS — F331 Major depressive disorder, recurrent, moderate: Secondary | ICD-10-CM | POA: Diagnosis not present

## 2023-11-13 DIAGNOSIS — F411 Generalized anxiety disorder: Secondary | ICD-10-CM | POA: Diagnosis not present
